# Patient Record
Sex: Female | Born: 1935 | Race: White | Hispanic: No
Health system: Southern US, Community
[De-identification: ages and names within clinical notes are randomized; demographics above are authoritative.]

## PROBLEM LIST (undated history)

## (undated) DIAGNOSIS — I10 Essential (primary) hypertension: Secondary | ICD-10-CM

## (undated) DIAGNOSIS — F32A Depression, unspecified: Secondary | ICD-10-CM

## (undated) DIAGNOSIS — F319 Bipolar disorder, unspecified: Secondary | ICD-10-CM

## (undated) DIAGNOSIS — G309 Alzheimer's disease, unspecified: Secondary | ICD-10-CM

## (undated) DIAGNOSIS — F0280 Dementia in other diseases classified elsewhere without behavioral disturbance: Secondary | ICD-10-CM

## (undated) DIAGNOSIS — F329 Major depressive disorder, single episode, unspecified: Secondary | ICD-10-CM

## (undated) DIAGNOSIS — M1990 Unspecified osteoarthritis, unspecified site: Secondary | ICD-10-CM

## (undated) DIAGNOSIS — E7800 Pure hypercholesterolemia, unspecified: Secondary | ICD-10-CM

---

## 2002-04-20 ENCOUNTER — Inpatient Hospital Stay (HOSPITAL_COMMUNITY): Admission: EM | Admit: 2002-04-20 | Discharge: 2002-04-28 | Payer: Self-pay | Admitting: Psychiatry

## 2002-04-22 ENCOUNTER — Encounter: Payer: Self-pay | Admitting: Psychiatry

## 2006-08-26 ENCOUNTER — Ambulatory Visit: Payer: Self-pay | Admitting: Gastroenterology

## 2008-08-12 ENCOUNTER — Ambulatory Visit: Payer: Self-pay | Admitting: Family Medicine

## 2009-03-15 ENCOUNTER — Ambulatory Visit: Payer: Self-pay | Admitting: Family Medicine

## 2012-07-05 ENCOUNTER — Emergency Department: Payer: Self-pay | Admitting: Emergency Medicine

## 2012-07-05 LAB — URINALYSIS, COMPLETE
Bacteria: NONE SEEN
Bilirubin,UR: NEGATIVE
Glucose,UR: NEGATIVE mg/dL (ref 0–75)
Ketone: NEGATIVE
Leukocyte Esterase: NEGATIVE
Nitrite: NEGATIVE
Ph: 5 (ref 4.5–8.0)
Protein: NEGATIVE
RBC,UR: 9 /HPF (ref 0–5)
Specific Gravity: 1.021 (ref 1.003–1.030)
Squamous Epithelial: NONE SEEN
WBC UR: 2 /HPF (ref 0–5)

## 2012-07-05 LAB — COMPREHENSIVE METABOLIC PANEL
Albumin: 4 g/dL (ref 3.4–5.0)
Alkaline Phosphatase: 89 U/L (ref 50–136)
Anion Gap: 6 — ABNORMAL LOW (ref 7–16)
BUN: 22 mg/dL — ABNORMAL HIGH (ref 7–18)
Bilirubin,Total: 0.2 mg/dL (ref 0.2–1.0)
Calcium, Total: 9.1 mg/dL (ref 8.5–10.1)
Chloride: 107 mmol/L (ref 98–107)
Co2: 28 mmol/L (ref 21–32)
Creatinine: 0.85 mg/dL (ref 0.60–1.30)
EGFR (African American): >60
EGFR (Non-African Amer.): >60
Glucose: 90 mg/dL (ref 65–99)
Osmolality: 284 (ref 275–301)
Potassium: 4.1 mmol/L (ref 3.5–5.1)
SGOT(AST): 33 U/L (ref 15–37)
SGPT (ALT): 23 U/L (ref 12–78)
Sodium: 141 mmol/L (ref 136–145)
Total Protein: 7.3 g/dL (ref 6.4–8.2)

## 2012-07-05 LAB — CBC WITH DIFFERENTIAL/PLATELET
Basophil #: 0 10*3/uL (ref 0.0–0.1)
Basophil %: 0.4 %
Eosinophil #: 0.1 10*3/uL (ref 0.0–0.7)
Eosinophil %: 0.6 %
HCT: 40.2 % (ref 35.0–47.0)
HGB: 13.5 g/dL (ref 12.0–16.0)
Lymphocyte #: 1.3 10*3/uL (ref 1.0–3.6)
Lymphocyte %: 15.7 %
MCH: 32.1 pg (ref 26.0–34.0)
MCHC: 33.7 g/dL (ref 32.0–36.0)
MCV: 96 fL (ref 80–100)
Monocyte #: 0.6 x10 3/mm (ref 0.2–0.9)
Monocyte %: 7.7 %
Neutrophil #: 6.1 10*3/uL (ref 1.4–6.5)
Neutrophil %: 75.6 %
Platelet: 175 10*3/uL (ref 150–440)
RBC: 4.21 10*6/uL (ref 3.80–5.20)
RDW: 12.5 % (ref 11.5–14.5)
WBC: 8.1 10*3/uL (ref 3.6–11.0)

## 2012-07-05 LAB — RAPID INFLUENZA A&B ANTIGENS

## 2012-07-05 LAB — TSH: Thyroid Stimulating Horm: 2.4 u[IU]/mL

## 2012-07-07 LAB — URINE CULTURE

## 2012-07-08 ENCOUNTER — Emergency Department: Payer: Self-pay | Admitting: Emergency Medicine

## 2012-07-08 LAB — COMPREHENSIVE METABOLIC PANEL
Albumin: 3.7 g/dL (ref 3.4–5.0)
Alkaline Phosphatase: 78 U/L (ref 50–136)
Anion Gap: 8 (ref 7–16)
BUN: 23 mg/dL — ABNORMAL HIGH (ref 7–18)
Bilirubin,Total: 0.2 mg/dL (ref 0.2–1.0)
Calcium, Total: 8.9 mg/dL (ref 8.5–10.1)
Chloride: 109 mmol/L — ABNORMAL HIGH (ref 98–107)
Co2: 29 mmol/L (ref 21–32)
Creatinine: 0.74 mg/dL (ref 0.60–1.30)
EGFR (African American): >60
EGFR (Non-African Amer.): >60
Glucose: 97 mg/dL (ref 65–99)
Osmolality: 294 (ref 275–301)
Potassium: 4.4 mmol/L (ref 3.5–5.1)
SGOT(AST): 35 U/L (ref 15–37)
SGPT (ALT): 23 U/L (ref 12–78)
Sodium: 146 mmol/L — ABNORMAL HIGH (ref 136–145)
Total Protein: 7.1 g/dL (ref 6.4–8.2)

## 2012-07-08 LAB — CBC
HCT: 38.8 % (ref 35.0–47.0)
HGB: 13 g/dL (ref 12.0–16.0)
MCH: 32.3 pg (ref 26.0–34.0)
MCHC: 33.4 g/dL (ref 32.0–36.0)
MCV: 97 fL (ref 80–100)
Platelet: 181 10*3/uL (ref 150–440)
RBC: 4.02 10*6/uL (ref 3.80–5.20)
RDW: 13.1 % (ref 11.5–14.5)
WBC: 7.2 10*3/uL (ref 3.6–11.0)

## 2012-07-08 LAB — URINALYSIS, COMPLETE
Bilirubin,UR: NEGATIVE
Blood: NEGATIVE
Glucose,UR: NEGATIVE mg/dL (ref 0–75)
Hyaline Cast: 5
Ph: 5 (ref 4.5–8.0)
RBC,UR: 5 /HPF (ref 0–5)
Specific Gravity: 1.026 (ref 1.003–1.030)
Squamous Epithelial: <1
WBC UR: 4 /HPF (ref 0–5)

## 2012-07-08 LAB — CK TOTAL AND CKMB (NOT AT ARMC): CK-MB: 1.4 ng/mL (ref 0.5–3.6)

## 2012-07-11 LAB — CULTURE, BLOOD (SINGLE)

## 2012-07-16 LAB — COMPREHENSIVE METABOLIC PANEL
Albumin: 3.4 g/dL (ref 3.4–5.0)
Anion Gap: 3 — ABNORMAL LOW (ref 7–16)
Bilirubin,Total: 0.3 mg/dL (ref 0.2–1.0)
Calcium, Total: 8.9 mg/dL (ref 8.5–10.1)
Chloride: 109 mmol/L — ABNORMAL HIGH (ref 98–107)
Creatinine: 0.62 mg/dL (ref 0.60–1.30)
EGFR (Non-African Amer.): >60
Glucose: 86 mg/dL (ref 65–99)
Osmolality: 288 (ref 275–301)
Potassium: 4 mmol/L (ref 3.5–5.1)
SGPT (ALT): 18 U/L (ref 12–78)
Sodium: 142 mmol/L (ref 136–145)

## 2012-07-16 LAB — CBC
HGB: 14.1 g/dL (ref 12.0–16.0)
MCHC: 32.8 g/dL (ref 32.0–36.0)
MCV: 97 fL (ref 80–100)
WBC: 5.9 10*3/uL (ref 3.6–11.0)

## 2012-07-16 LAB — TROPONIN I: Troponin-I: <0.02 ng/mL

## 2012-07-20 LAB — URINALYSIS, COMPLETE
Ketone: NEGATIVE
Ph: 5 (ref 4.5–8.0)
Protein: NEGATIVE
RBC,UR: 4 /HPF (ref 0–5)
Specific Gravity: 1.026 (ref 1.003–1.030)
Squamous Epithelial: NONE SEEN
WBC UR: 41 /HPF (ref 0–5)

## 2014-05-19 ENCOUNTER — Emergency Department: Payer: Self-pay | Admitting: Emergency Medicine

## 2014-05-20 ENCOUNTER — Inpatient Hospital Stay: Payer: Self-pay | Admitting: Internal Medicine

## 2014-05-20 LAB — CBC WITH DIFFERENTIAL/PLATELET
Basophil #: 0 10*3/uL (ref 0.0–0.1)
Basophil %: 0.3 %
Eosinophil #: 0 10*3/uL (ref 0.0–0.7)
Eosinophil %: 0 %
HCT: 38 % (ref 35.0–47.0)
HGB: 12.6 g/dL (ref 12.0–16.0)
Lymphocyte #: 0.2 10*3/uL — ABNORMAL LOW (ref 1.0–3.6)
Lymphocyte %: 1.3 %
MCH: 32.3 pg (ref 26.0–34.0)
MCHC: 33.3 g/dL (ref 32.0–36.0)
MCV: 97 fL (ref 80–100)
MONO ABS: 1.1 x10 3/mm — AB (ref 0.2–0.9)
Monocyte %: 5.8 %
Neutrophil #: 17.2 10*3/uL — ABNORMAL HIGH (ref 1.4–6.5)
Neutrophil %: 92.6 %
PLATELETS: 159 10*3/uL (ref 150–440)
RBC: 3.92 10*6/uL (ref 3.80–5.20)
RDW: 13.2 % (ref 11.5–14.5)
WBC: 18.6 10*3/uL — ABNORMAL HIGH (ref 3.6–11.0)

## 2014-05-20 LAB — URINALYSIS, COMPLETE
Bilirubin,UR: NEGATIVE
Glucose,UR: NEGATIVE mg/dL (ref 0–75)
Ketone: NEGATIVE
Nitrite: POSITIVE
Ph: 5 (ref 4.5–8.0)
Protein: NEGATIVE
RBC,UR: 702 /HPF (ref 0–5)
SPECIFIC GRAVITY: 1.021 (ref 1.003–1.030)
SQUAMOUS EPITHELIAL: NONE SEEN
WBC UR: 55 /HPF (ref 0–5)

## 2014-05-20 LAB — BASIC METABOLIC PANEL
Anion Gap: 5 — ABNORMAL LOW (ref 7–16)
Anion Gap: 7 (ref 7–16)
BUN: 28 mg/dL — AB (ref 7–18)
BUN: 25 mg/dL — ABNORMAL HIGH (ref 7–18)
CALCIUM: 8.8 mg/dL (ref 8.5–10.1)
CO2: 29 mmol/L (ref 21–32)
CREATININE: 1.24 mg/dL (ref 0.60–1.30)
Calcium, Total: 8.9 mg/dL (ref 8.5–10.1)
Chloride: 109 mmol/L — ABNORMAL HIGH (ref 98–107)
Chloride: 106 mmol/L (ref 98–107)
Co2: 30 mmol/L (ref 21–32)
Creatinine: 0.79 mg/dL (ref 0.60–1.30)
EGFR (African American): >60
EGFR (African American): 54 — ABNORMAL LOW
EGFR (Non-African Amer.): >60
EGFR (Non-African Amer.): 45 — ABNORMAL LOW
GLUCOSE: 102 mg/dL — AB (ref 65–99)
Glucose: 113 mg/dL — ABNORMAL HIGH (ref 65–99)
Osmolality: 293 (ref 275–301)
Osmolality: 288 (ref 275–301)
POTASSIUM: 4 mmol/L (ref 3.5–5.1)
POTASSIUM: 3.3 mmol/L — AB (ref 3.5–5.1)
SODIUM: 142 mmol/L (ref 136–145)
Sodium: 144 mmol/L (ref 136–145)

## 2014-05-20 LAB — CBC
HCT: 37.1 % (ref 35.0–47.0)
HGB: 12.2 g/dL (ref 12.0–16.0)
MCH: 32.4 pg (ref 26.0–34.0)
MCHC: 33 g/dL (ref 32.0–36.0)
MCV: 98 fL (ref 80–100)
Platelet: 155 10*3/uL (ref 150–440)
RBC: 3.77 10*6/uL — ABNORMAL LOW (ref 3.80–5.20)
RDW: 13.2 % (ref 11.5–14.5)
WBC: 6.8 10*3/uL (ref 3.6–11.0)

## 2014-05-20 LAB — TROPONIN I

## 2014-05-20 LAB — PRO B NATRIURETIC PEPTIDE: B-Type Natriuretic Peptide: 106 pg/mL (ref 0–450)

## 2014-05-21 LAB — CBC WITH DIFFERENTIAL/PLATELET
BASOS PCT: 0.4 %
Basophil #: 0 10*3/uL (ref 0.0–0.1)
Eosinophil #: 0 10*3/uL (ref 0.0–0.7)
Eosinophil %: 0.2 %
HCT: 31.7 % — ABNORMAL LOW (ref 35.0–47.0)
HGB: 10.5 g/dL — AB (ref 12.0–16.0)
LYMPHS ABS: 0.4 10*3/uL — AB (ref 1.0–3.6)
LYMPHS PCT: 4.6 %
MCH: 32.5 pg (ref 26.0–34.0)
MCHC: 33.1 g/dL (ref 32.0–36.0)
MCV: 98 fL (ref 80–100)
MONOS PCT: 6.4 %
Monocyte #: 0.5 x10 3/mm (ref 0.2–0.9)
Neutrophil #: 6.9 10*3/uL — ABNORMAL HIGH (ref 1.4–6.5)
Neutrophil %: 88.4 %
Platelet: 112 10*3/uL — ABNORMAL LOW (ref 150–440)
RBC: 3.24 10*6/uL — ABNORMAL LOW (ref 3.80–5.20)
RDW: 13.6 % (ref 11.5–14.5)
WBC: 7.8 10*3/uL (ref 3.6–11.0)

## 2014-05-21 LAB — BASIC METABOLIC PANEL
Anion Gap: 5 — ABNORMAL LOW (ref 7–16)
BUN: 22 mg/dL — ABNORMAL HIGH (ref 7–18)
CALCIUM: 8.2 mg/dL — AB (ref 8.5–10.1)
Chloride: 111 mmol/L — ABNORMAL HIGH (ref 98–107)
Co2: 26 mmol/L (ref 21–32)
Creatinine: 0.82 mg/dL (ref 0.60–1.30)
GLUCOSE: 85 mg/dL (ref 65–99)
Osmolality: 286 (ref 275–301)
Potassium: 3.4 mmol/L — ABNORMAL LOW (ref 3.5–5.1)
Sodium: 142 mmol/L (ref 136–145)

## 2014-05-21 LAB — MAGNESIUM: Magnesium: 1.9 mg/dL

## 2014-05-22 LAB — BASIC METABOLIC PANEL
ANION GAP: 8 (ref 7–16)
BUN: 14 mg/dL (ref 7–18)
Calcium, Total: 8.3 mg/dL — ABNORMAL LOW (ref 8.5–10.1)
Chloride: 111 mmol/L — ABNORMAL HIGH (ref 98–107)
Co2: 25 mmol/L (ref 21–32)
Creatinine: 0.68 mg/dL (ref 0.60–1.30)
GLUCOSE: 87 mg/dL (ref 65–99)
OSMOLALITY: 287 (ref 275–301)
Potassium: 3.7 mmol/L (ref 3.5–5.1)
Sodium: 144 mmol/L (ref 136–145)

## 2014-05-22 LAB — PLATELET COUNT: Platelet: 119 10*3/uL — ABNORMAL LOW (ref 150–440)

## 2014-05-23 LAB — PLATELET COUNT: PLATELETS: 128 10*3/uL — AB (ref 150–440)

## 2014-05-23 LAB — URINE CULTURE

## 2014-05-25 LAB — CULTURE, BLOOD (SINGLE)

## 2014-06-12 ENCOUNTER — Emergency Department: Payer: Self-pay | Admitting: Emergency Medicine

## 2014-06-30 ENCOUNTER — Emergency Department: Payer: Self-pay | Admitting: Emergency Medicine

## 2014-07-10 ENCOUNTER — Emergency Department: Payer: Self-pay | Admitting: Emergency Medicine

## 2014-08-03 ENCOUNTER — Emergency Department: Payer: Self-pay | Admitting: Emergency Medicine

## 2014-09-09 NOTE — Consult Note (Signed)
Details:   - Psychiatry: Came to check on patient. She is still awake but not communicating. Not getting agressive. We are still waiting for a bed at a gero unit per the wishes of the sisters.   Electronic Signatures: Audery Amellapacs, Esabella Stockinger T (MD)  (Signed 21-Feb-14 23:23)  Authored: Details   Last Updated: 21-Feb-14 23:23 by Audery Amellapacs, Bettyann Birchler T (MD)

## 2014-09-09 NOTE — Consult Note (Signed)
Brief Consult Note: Comments: Psychiatry: Update to previous note. The patient's sister, who is power of attorney, met me in the hall and told me she very much wants the patient to go to [place]. Patient is under IVC. I relayed this to the intake nurse and we will initiate referral process to find a geeropsychiatry bed. Meanwhile continnue IVC. I will write for full dose of 69m Zyprexa.  Electronic Signatures: CGonzella Lex(MD)  (Signed 19-Feb-14 18:51)  Authored: Brief Consult Note   Last Updated: 19-Feb-14 18:51 by CGonzella Lex(MD)

## 2014-09-09 NOTE — Consult Note (Signed)
Brief Consult Note: Diagnosis: Dementia Alzhiemer's type, Bipolar DO NOS.   Patient was seen by consultant.   Recommend further assessment or treatment.   Comments: Pt seen in ED. She remains calm but has episode of agitation at times. She did not respond to any questions. Compliant with meds.  Awaiting for disposition.  Will add Namenda 5mg  po qdaily to help with her agitation.  Continue other meds.  Electronic Signatures: Rhunette CroftFaheem, Tionne Dayhoff S (MD)  (Signed 25-Feb-14 12:41)  Authored: Brief Consult Note   Last Updated: 25-Feb-14 12:41 by Rhunette CroftFaheem, Joron Velis S (MD)
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
## 2014-09-09 NOTE — Consult Note (Signed)
Brief Consult Note: Diagnosis: Dementia, Alzhiemer type. Bipolar DO NOS.   Patient was seen by consultant.   Orders entered.   Comments: Pt remains mute and she did not respond to any of the questions. Most of the history obtained from the staff and her records. Staff reported that she is not eating well. She opens her eyes intermittently since this morning. No aggressive behavior noted.  Plan: Will decrease Zyprexa to 2.5 mg po qam and 5mg  po qhs.  Decrease Celexa 20 mg po qhs.  Continue with Aricept.  Awaiting placement.  Electronic Signatures: Rhunette CroftFaheem, Ansh Fauble S (MD)  (Signed 20-Feb-14 10:35)  Authored: Brief Consult Note   Last Updated: 20-Feb-14 10:35 by Rhunette CroftFaheem, Javyn Havlin S (MD)

## 2014-09-09 NOTE — Consult Note (Signed)
Brief Consult Note: Diagnosis: Dementia Alzhiemer's type, Bipolar DO NOS.   Patient was seen by consultant.   Recommend further assessment or treatment.   Comments: Pt seen in ED. She was sleeping this am and did not participate in interview. Staff reported that she has episodes of agitation at times. She has been compliant with medications and is still awaiting disposition.  She was started on Namenda recently and she is tolerating it well. Awaiting placement.  Electronic Signatures: Rhunette CroftFaheem, Sonjia Wilcoxson S (MD)  (Signed 27-Feb-14 12:17)  Authored: Brief Consult Note   Last Updated: 27-Feb-14 12:17 by Rhunette CroftFaheem, Keyri Salberg S (MD)
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
## 2014-09-14 NOTE — H&P (Signed)
PATIENT NAME:  Barnabas HarriesRUMBLEY, Joanna D MR#:  [id number] DATE OF BIRTH:  Oct 16, 1935  DATE OF ADMISSION:  05/20/2014  PRIMARY CARE PHYSICIAN:  [facility], Dr. Roxanne MinsPaula Bhaglia.    CHIEF COMPLAINT:  Brought back into the ER secondary to fever of 103.   HISTORY OF PRESENT ILLNESS: This is a 79 year old female who was brought to the hospital yesterday, was diagnosed with a urinary tract infection, given a script for Bactrim and sent back to [facility]. Her blood pressure in the ER was up and she was clammy and sweaty and not responding as well as she normally does. Back at [facility] she spiked a fever of 103, vomited, and then her blood pressure was low. She had laboratories that were redrawn here in the ER, white count was elevated at 18,000. Urinalysis from last night was positive and she had a fever of 103 at the facility and was given Tylenol for that. The patient secondary to dementia is unable to give any history. The patient's sisters are at the bedside, able to give history.   PAST MEDICAL HISTORY: Alzheimer's, bipolar disorder, hypertension, osteoarthritis, hyperlipidemia, meningioma on the brain.   PAST SURGICAL HISTORY: Hysterectomy, possible tonsillectomy in the past.   ALLERGIES: CEPHALOSPORIN, CIPRO, CODEINE, NEURONTIN, PENICILLIN, AND SHELLFISH.   MEDICATIONS: As per prescription writer include aspirin 81 mg daily, Zyrtec 10 mg daily, Celexa 20 mg daily, Colace 10 mg/mL 10 mL twice a day, benazepril 10 mg daily, lamotrigine 100 mg daily, loperamide 2 mg p.r.n., lovastatin 40 mg daily, Mapap 500 mg every 4 hours as needed for headache or pain, meloxicam 7.5 mg daily, Namenda 5 mg twice a day, Maalox 30 mL 4 times a day as needed for indigestion, milk of magnesia 30 mL once a day at bedtime as needed for constipation, nystatin topical under breasts and buttock twice a day as needed, olanzapine 2.5 mg during the day and 5 mg at night, Risamine 0.44% and 20.625% topical ointment to  affected area twice a day as needed, Robitussin 10 mL every 6 hours as needed, trazodone 50 mg half tablet every 12 hours as needed for agitation, triple antibiotic solution as needed, vitamin B1, 100 mg daily, vitamin B12, 1000 mcg daily, vitamin D3, 1000 international units daily.   SOCIAL HISTORY: Currently lives at [facility]. No smoking. No alcohol. No drug use. Was a medical transcriptionist in the past.    FAMILY HISTORY: Mother was killed and father committed suicide in a murder/suicide many years ago.   REVIEW OF SYSTEMS: Unable to obtain secondary to altered mental status and dementia.   PHYSICAL EXAMINATION:  VITAL SIGNS: Current temperature 98.1, pulse 68, respirations 17, blood pressure 96/56, pulse oximetry 99% on room air.  GENERAL: No respiratory distress. Looking ill, lying in bed.  EYES: Conjunctivae and lids normal. Pupils equal, round, and reactive to light. Unable to test extraocular muscles.  EARS, NOSE, MOUTH, AND THROAT: Tympanic membranes no erythema. Nasal mucosa, no erythema. Throat, no erythema, no exudate seen. Lips and gums, no lesions.  NECK: No JVD. No bruits. No lymphadenopathy. No thyromegaly. No thyroid nodules palpated.  LUNGS: Clear to auscultation. No use of accessory muscles to breathe. No rhonchi, rales, or wheeze heard.  CARDIOVASCULAR: S1, S2 normal, positive 3 out of 6 systolic ejection murmur. Carotid upstroke 2 + bilaterally. No bruits.  EXTREMITIES: Dorsalis pedis pulses 1 + bilaterally. 2 + edema bilateral lower extremity.  ABDOMEN: Soft. Slight tenderness over the bladder area and the  lower abdomen. No organomegaly/splenomegaly. Normoactive bowel sounds. No masses felt.  LYMPHATIC: No lymph nodes in the neck.  MUSCULOSKELETAL: No clubbing. No cyanosis. 2 + edema.  SKIN: Sunken-in eyelids.  NEUROLOGIC: Cranial nerves difficult to test secondary to altered mental status, was able to move her arms a little bit. Deep tendon reflexes 1 +  bilateral lower extremity. Babinski negative. PSYCHIATRIC: Unable to test secondary to altered mental status. Did answer 1 question, no.   LABORATORY AND RADIOLOGICAL DATA: Troponin negative. White blood cell count 6.8, H and H 12.2 and 37.1, platelet count of 155,000. Glucose 102, BUN 28, creatinine 0.79, sodium 144, potassium 4.0, chloride 109, CO2 of 30, calcium 8.9, those were laboratories from last night. Repeat laboratories show a lactic acid of 3.3. White blood cell count 18.6, hemoglobin 12.6, hematocrit 38.0, platelet count of 159,000. Glucose 113, BUN 25, creatinine 1.24, sodium 142, potassium 3.3, chloride 106, CO2 of 29, calcium 8.8. Troponin negative. Urinalysis from last night trace leukocyte esterase, positive for nitrites. Chest x-ray negative from last night and CT scan of the head showed unchanged medial left frontal lobe mass compatible with meningioma, unchanged mass effect, atrophy, and chronic ischemic white matter disease.   ASSESSMENT AND PLAN:  1.  Clinical sepsis with fever of 103 as outpatient and leukocytosis, urinary tract infection, cystitis, and hypotension. We will give IV fluid hydration, IV Levaquin. Blood cultures ordered. Urine culture sent from last night. Continue to monitor clinically. Overall prognosis is poor. The patient was made a DNR in the Emergency Room.  2.  Bipolar disorder and dementia. Continue usual psychiatric medications if able to take. 3.  Hyperlipidemia. Continue statin medication.  4.  Osteoarthritis. Continue p.r.n. pain medications and anti-inflammatories.   TIME SPENT ON ADMISSION: 50 minutes.   CODE STATUS:  The patient is a Do Not Resuscitate.      ____________________________ Herschell Dimes. Renae Gloss, MD rjw:bu D: 05/20/2014 18:18:23 ET T: 05/20/2014 18:36:44 ET JOB#: [id number]  cc: Herschell Dimes. Renae Gloss, MD, <Dictator> Salley Scarlet MD ELECTRONICALLY SIGNED 05/29/2014 15:09

## 2014-09-18 NOTE — Discharge Summary (Signed)
PATIENT NAME:  Barnabas HarriesRUMBLEY, Joanna D MR#:  [id number] DATE OF BIRTH:  08/28/1935  DATE OF ADMISSION:  05/20/2014 DATE OF DISCHARGE:  05/23/2014  DISCHARGE DIAGNOSES: Sepsis with urinary tract infection, cystitis, bipolar disorder, dementia, and hyperlipidemia.   CONDITION: Stable.   CODE STATUS: DNR.  HOME MEDICATIONS: Please refer to the medication reconciliation list.   DIET: Low-sodium, low-fat, low-cholesterol diet.   ACTIVITY: As tolerated.   FOLLOWUP CARE: Follow with PCP within 1-2 weeks.   REASON FOR ADMISSION: Fever of 103.   HISTORY OF PRESENT ILLNESS: The patient is a 79 year old Caucasian female with a history of Alzheimer dementia, bipolar disorder, hypertension, and hyperlipidemia, who was sent from the nursing home due to a fever of 103. The patient was noticed to have elevated WBC of 18,000. Urinalysis showed UTI.   For detailed history and physical examination, please refer to the admission note dictated by Dr. Renae GlossWieting.   LABORATORY DATA: Troponin was negative. WBC 18,000. BUN 28, creatinine 0.79. Electrolytes were normal.   HOSPITAL COURSE: 1.  Sepsis with urinary tract infection/cystitis. The patient has been treated with IV fluid support with IV Levaquin. Blood culture is negative. Urine culture showed E Coli. After IV antibiotic treatment, the patient's white count decreased to 7.8.  Urine culture showed E Coli. The patient will need bactrim po for 5 days after discharge. 2.  Dehydration, which has been treated with IV fluid support. BUN decreased to 14.  The patient is demented, but has no complaints. Vital signs are stable.   PHYSICAL EXAMINATION: Unremarkable. In no acute distress. Vital signs are stable. She is clinically stable, and will be discharged to the nursing home today.   I discussed the patient's discharge plan with the patient, the nurse, the social worker and the case manager.   TIME SPENT: About 38 minutes.    ____________________________ Shaune PollackQing  Keisha Amer, MD qc:MT D: 05/23/2014 11:37:38 ET T: 05/23/2014 12:16:51 ET JOB#: [id number]  cc: Shaune PollackQing Hyder Deman, MD, <Dictator> Shaune PollackQING Leevon Upperman MD ELECTRONICALLY SIGNED 05/23/2014 17:07

## 2014-11-25 ENCOUNTER — Encounter: Payer: Self-pay | Admitting: Emergency Medicine

## 2014-11-25 ENCOUNTER — Emergency Department
Admission: EM | Admit: 2014-11-25 | Discharge: 2014-11-25 | Disposition: A | Discharge status: Home / Self Care | Payer: Medicare Other | Attending: Emergency Medicine | Admitting: Emergency Medicine

## 2014-11-25 ENCOUNTER — Emergency Department: Payer: Medicare Other

## 2014-11-25 DIAGNOSIS — I10 Essential (primary) hypertension: Secondary | ICD-10-CM | POA: Diagnosis not present

## 2014-11-25 DIAGNOSIS — W1839XA Other fall on same level, initial encounter: Secondary | ICD-10-CM | POA: Insufficient documentation

## 2014-11-25 DIAGNOSIS — Y92129 Unspecified place in nursing home as the place of occurrence of the external cause: Secondary | ICD-10-CM | POA: Insufficient documentation

## 2014-11-25 DIAGNOSIS — Y9389 Activity, other specified: Secondary | ICD-10-CM | POA: Insufficient documentation

## 2014-11-25 DIAGNOSIS — F319 Bipolar disorder, unspecified: Secondary | ICD-10-CM | POA: Diagnosis not present

## 2014-11-25 DIAGNOSIS — Y998 Other external cause status: Secondary | ICD-10-CM | POA: Insufficient documentation

## 2014-11-25 DIAGNOSIS — S0031XA Abrasion of nose, initial encounter: Secondary | ICD-10-CM | POA: Insufficient documentation

## 2014-11-25 DIAGNOSIS — S3992XA Unspecified injury of lower back, initial encounter: Secondary | ICD-10-CM | POA: Diagnosis present

## 2014-11-25 DIAGNOSIS — Z880 Allergy status to penicillin: Secondary | ICD-10-CM | POA: Insufficient documentation

## 2014-11-25 DIAGNOSIS — G309 Alzheimer's disease, unspecified: Secondary | ICD-10-CM | POA: Insufficient documentation

## 2014-11-25 DIAGNOSIS — W19XXXA Unspecified fall, initial encounter: Secondary | ICD-10-CM

## 2014-11-25 DIAGNOSIS — F0280 Dementia in other diseases classified elsewhere without behavioral disturbance: Secondary | ICD-10-CM | POA: Insufficient documentation

## 2014-11-25 DIAGNOSIS — S300XXA Contusion of lower back and pelvis, initial encounter: Secondary | ICD-10-CM | POA: Insufficient documentation

## 2014-11-25 HISTORY — DX: Alzheimer's disease, unspecified: G30.9

## 2014-11-25 HISTORY — DX: Essential (primary) hypertension: I10

## 2014-11-25 HISTORY — DX: Unspecified osteoarthritis, unspecified site: M19.90

## 2014-11-25 HISTORY — DX: Pure hypercholesterolemia, unspecified: E78.00

## 2014-11-25 HISTORY — DX: Bipolar disorder, unspecified: F31.9

## 2014-11-25 HISTORY — DX: Depression, unspecified: F32.A

## 2014-11-25 HISTORY — DX: Dementia in other diseases classified elsewhere, unspecified severity, without behavioral disturbance, psychotic disturbance, mood disturbance, and anxiety: F02.80

## 2014-11-25 HISTORY — DX: Major depressive disorder, single episode, unspecified: F32.9

## 2014-11-25 NOTE — ED Notes (Signed)
Pt presents to ED via EMS with c/o of fall from [facility]. EMS states pt was found by staff laying supine in facility walkway, unknown of fall origin/cause. EMS reports fall was unwitnessed by staff. EMS states pt has a hx of falls from current nursing facility. EMS reports no obvious deformities and lacerations upon arrival to ER. Pt arrived non-verbal and correlated with current neurological baseline. No obvious physical deformities noted. No lacerations noted.

## 2014-11-25 NOTE — Discharge Instructions (Signed)
Fall Prevention in Hospitals °As a hospital patient, your condition and the treatments you receive can increase your risk for falls. Some additional risk factors for falls in a hospital include: °· Being in an unfamiliar environment. °· Being on bed rest. °· Your surgery. °· Taking certain medicines. °· Your tubing requirements, such as intravenous (IV) therapy or catheters. °It is important that you learn how to decrease fall risks while at the hospital. Below are important tips that can help prevent falls. °SAFETY TIPS FOR PREVENTING FALLS °Talk about your risk of falling. °· Ask your caregiver why you are at risk for falling. Is it your medicine, illness, tubing placement, or something else? °· Make a plan with your caregiver to keep you safe from falls. °· Ask your caregiver or pharmacist about side effect of your medicines. Some medicines can make you dizzy or affect your coordination. °Ask for help. °· Ask for help before getting out of bed. You may need to press your call button. °· Ask for assistance in getting you safely to the toilet. °· Ask for a walker or cane to be put at your bedside. Ask that most of the side rails on your bed be placed up before your caregiver leaves the room. °· Ask family or friends to sit with you. °· Ask for things that are out of your reach, such as your glasses, hearing aids, telephone, bedside table, or call button. °Follow these tips to avoid falling: °· Stay lying or seated, rather than standing, while waiting for help. °· Wear rubber-soled slippers or shoes whenever you walk in the hospital. °· Avoid quick, sudden movements. °¨ Change positions slowly. °¨ Sit on the side of your bed before standing. °¨ Stand up slowly and wait before you start to walk. °· Let your caregiver know if there is a spill on the floor. °· Pay careful attention to the medical equipment, electrical cords, and tubes around you. °· When you need help, use your call button by your bed or in the  bathroom. Wait for one of your caregivers to help you. °· If you feel dizzy or unsure of your footing, return to bed and wait for assistance. °· Avoid being distracted by the TV, telephone, or another person in your room. °· Do not lean or support yourself on rolling objects, such as IV poles or bedside tables. °Document Released: 05/03/2000 Document Revised: 04/22/2012 Document Reviewed: 01/12/2012 °[employer]® Patient Information ©2015 [employer]. This information is not intended to replace advice given to you by your health care provider. Make sure you discuss any questions you have with your health care provider. ° °

## 2014-11-25 NOTE — ED Notes (Signed)
Patient back from  X-ray 

## 2014-11-25 NOTE — ED Notes (Signed)
Patient transported to X-ray 

## 2014-11-25 NOTE — ED Provider Notes (Signed)
[facility] Emergency Department Provider Note  ____________________________________________  Time seen: Approximately 6 AM  I have reviewed the triage vital signs and the nursing notes.   HISTORY  Chief Complaint Fall    HPI Joanna Wade is a 79 y.o. female with a history of dementia, in the memory care unit for the past 2 years who presents today with a possible fall. Per the staff and EMS the patient was found sitting in the hallway today at her memory care unit. The staff noted a small abrasion to the left tip of her nose which the siblings knee. They were unsure if she may have hit her head. The patient has had multiple falls and is at her baseline mental status per her staff and also the patient's sister, Ms. Joanna Wade.     Past Medical History  Diagnosis Date  . Alzheimer disease   . Bipolar 1 disorder   . Depression   . Arthritis   . Hypertension   . Hypercholesteremia     There are no active problems to display for this patient.   History reviewed. No pertinent past surgical history.  No current outpatient prescriptions on file.  Allergies Cephalosporins; Codeine; Gabapentin; Penicillins; and Quinolones  History reviewed. No pertinent family history.  Social History History  Substance Use Topics  . Smoking status: Unknown If Ever Smoked  . Smokeless tobacco: Not on file  . Alcohol Use: No    Review of Systems Caveat due to patient with advanced dementia and not able to answer questions. ____________________________________________   PHYSICAL EXAM:  VITAL SIGNS: ED Triage Vitals  Enc Vitals Group     BP 11/25/14 0608 118/85 mmHg     Pulse Rate 11/25/14 0608 91     Resp 11/25/14 0608 19     Temp 11/25/14 0608 97.8 F (36.6 C)     Temp Source 11/25/14 0608 Oral     SpO2 11/25/14 0608 97 %     Weight 11/25/14 0608 168 lb (76.204 kg)     Height 11/25/14 0608 5\' 5"  (1.651 m)     Head Cir --      Peak Flow --    Pain Score --      Pain Loc --      Pain Edu? --      Excl. in GC? --     Constitutional: Alert and oriented. Well appearing and in no acute distress. Eyes: Conjunctivae are normal. PERRL. EOMI. Head: Atraumatic. Nose: No congestion/rhinnorhea. Mouth/Throat: Mucous membranes are moist.  Oropharynx non-erythematous. Neck: No stridor.   Cardiovascular: Normal rate, regular rhythm. Grossly normal heart sounds.  Good peripheral circulation. Respiratory: Normal respiratory effort.  No retractions. Lungs CTAB. Gastrointestinal: Soft and nontender. No distention. No abdominal bruits. No CVA tenderness. Musculoskeletal: No lower extremity tenderness nor edema.  No joint effusions. Hips range smoothly bilaterally and without any signs of pain from the patient. Neurologic:   No gross focal neurologic deficits are appreciated. Patient says single words but not in response to any questions. Sister is at the bedside and says this is her baseline. Skin:  Skin is warm, dry and intact. Right buttock with area of ecchymosis that is about 3 x 6 cm. Appears old. Small abrasion to the anterior left exterior of the nose. No active bleeding, induration or pus. Is about 3 mm x 1 cm. Indeterminant age Psychiatric: Mood and affect are normal. Speech and behavior are normal.  ____________________________________________   LABS (all labs ordered  are listed, but only abnormal results are displayed)  Labs Reviewed - No data to display ____________________________________________  EKG   ____________________________________________  RADIOLOGY  No acute findings on the pelvic x-ray. ____________________________________________   PROCEDURES    ____________________________________________   INITIAL IMPRESSION / ASSESSMENT AND PLAN / ED COURSE  Pertinent labs & imaging results that were available during my care of the patient were reviewed by me and considered in my medical decision making (see chart  for details).  ----------------------------------------- 7:12 AM on 11/25/2014 ----------------------------------------- Discussed case with sister. Agrees that limited workup is probably best for the patient. Understands that extensive procedures is a surgery for a intracranial hemorrhage is not realistic at this time. Patient is not in any acute distress and will be discharged back to her memory care unit.  ____________________________________________   FINAL CLINICAL IMPRESSION(S) / ED DIAGNOSES  Acute fall. Return visit.    Myrna Blazer, MD 11/25/14 [phone]

## 2014-12-11 ENCOUNTER — Other Ambulatory Visit: Payer: Self-pay

## 2014-12-11 ENCOUNTER — Emergency Department
Admission: EM | Admit: 2014-12-11 | Discharge: 2014-12-11 | Disposition: A | Discharge status: Home / Self Care | Payer: Medicare Other | Attending: Emergency Medicine | Admitting: Emergency Medicine

## 2014-12-11 ENCOUNTER — Emergency Department: Payer: Medicare Other

## 2014-12-11 ENCOUNTER — Encounter: Payer: Self-pay | Admitting: Emergency Medicine

## 2014-12-11 DIAGNOSIS — I10 Essential (primary) hypertension: Secondary | ICD-10-CM | POA: Diagnosis not present

## 2014-12-11 DIAGNOSIS — Y998 Other external cause status: Secondary | ICD-10-CM | POA: Insufficient documentation

## 2014-12-11 DIAGNOSIS — Z7982 Long term (current) use of aspirin: Secondary | ICD-10-CM | POA: Insufficient documentation

## 2014-12-11 DIAGNOSIS — Z880 Allergy status to penicillin: Secondary | ICD-10-CM | POA: Insufficient documentation

## 2014-12-11 DIAGNOSIS — W01198A Fall on same level from slipping, tripping and stumbling with subsequent striking against other object, initial encounter: Secondary | ICD-10-CM | POA: Diagnosis not present

## 2014-12-11 DIAGNOSIS — Z791 Long term (current) use of non-steroidal anti-inflammatories (NSAID): Secondary | ICD-10-CM | POA: Insufficient documentation

## 2014-12-11 DIAGNOSIS — S0990XA Unspecified injury of head, initial encounter: Secondary | ICD-10-CM | POA: Diagnosis present

## 2014-12-11 DIAGNOSIS — S01112A Laceration without foreign body of left eyelid and periocular area, initial encounter: Secondary | ICD-10-CM | POA: Diagnosis not present

## 2014-12-11 DIAGNOSIS — Y9389 Activity, other specified: Secondary | ICD-10-CM | POA: Diagnosis not present

## 2014-12-11 DIAGNOSIS — Z79899 Other long term (current) drug therapy: Secondary | ICD-10-CM | POA: Diagnosis not present

## 2014-12-11 DIAGNOSIS — Z23 Encounter for immunization: Secondary | ICD-10-CM | POA: Insufficient documentation

## 2014-12-11 DIAGNOSIS — Y92129 Unspecified place in nursing home as the place of occurrence of the external cause: Secondary | ICD-10-CM | POA: Insufficient documentation

## 2014-12-11 DIAGNOSIS — W19XXXA Unspecified fall, initial encounter: Secondary | ICD-10-CM

## 2014-12-11 MED ORDER — LIDOCAINE HCL (PF) 1 % IJ SOLN
5.0000 mL | Freq: Once | INTRAMUSCULAR | Status: AC
  Administered 2014-12-11: 5 mL via INTRADERMAL
  Filled 2014-12-11: qty 5

## 2014-12-11 MED ORDER — TETANUS-DIPHTHERIA TOXOIDS TD 5-2 LFU IM INJ
0.5000 mL | INJECTION | Freq: Once | INTRAMUSCULAR | Status: AC
  Administered 2014-12-11: 0.5 mL via INTRAMUSCULAR
  Filled 2014-12-11: qty 0.5

## 2014-12-11 NOTE — Discharge Instructions (Signed)
Your laceration was closed with absorbable sutures. These will fall out on their own. We gave you a tetanus shot today in the ER.  Facial Laceration  A facial laceration is a cut on the face. These injuries can be painful and cause bleeding. Lacerations usually heal quickly, but they need special care to reduce scarring. DIAGNOSIS  Your health care provider will take a medical history, ask for details about how the injury occurred, and examine the wound to determine how deep the cut is. TREATMENT  Some facial lacerations may not require closure. Others may not be able to be closed because of an increased risk of infection. The risk of infection and the chance for successful closure will depend on various factors, including the amount of time since the injury occurred. The wound may be cleaned to help prevent infection. If closure is appropriate, pain medicines may be given if needed. Your health care provider will use stitches (sutures), wound glue (adhesive), or skin adhesive strips to repair the laceration. These tools bring the skin edges together to allow for faster healing and a better cosmetic outcome. If needed, you may also be given a tetanus shot. HOME CARE INSTRUCTIONS  Only take over-the-counter or prescription medicines as directed by your health care provider.  Follow your health care provider's instructions for wound care. These instructions will vary depending on the technique used for closing the wound. For Sutures:  Keep the wound clean and dry.   If you were given a bandage (dressing), you should change it at least once a day. Also change the dressing if it becomes wet or dirty, or as directed by your health care provider.   Wash the wound with soap and water 2 times a day. Rinse the wound off with water to remove all soap. Pat the wound dry with a clean towel.   After cleaning, apply a thin layer of the antibiotic ointment recommended by your health care provider. This  will help prevent infection and keep the dressing from sticking.   You may shower as usual after the first 24 hours. Do not soak the wound in water until the sutures are removed.   Get your sutures removed as directed by your health care provider. With facial lacerations, sutures should usually be taken out after 4-5 days to avoid stitch marks.   Wait a few days after your sutures are removed before applying any makeup. For Skin Adhesive Strips:  Keep the wound clean and dry.   Do not get the skin adhesive strips wet. You may bathe carefully, using caution to keep the wound dry.   If the wound gets wet, pat it dry with a clean towel.   Skin adhesive strips will fall off on their own. You may trim the strips as the wound heals. Do not remove skin adhesive strips that are still stuck to the wound. They will fall off in time.  For Wound Adhesive:  You may briefly wet your wound in the shower or bath. Do not soak or scrub the wound. Do not swim. Avoid periods of heavy sweating until the skin adhesive has fallen off on its own. After showering or bathing, gently pat the wound dry with a clean towel.   Do not apply liquid medicine, cream medicine, ointment medicine, or makeup to your wound while the skin adhesive is in place. This may loosen the film before your wound is healed.   If a dressing is placed over the wound, be careful not  to apply tape directly over the skin adhesive. This may cause the adhesive to be pulled off before the wound is healed.   Avoid prolonged exposure to sunlight or tanning lamps while the skin adhesive is in place.  The skin adhesive will usually remain in place for 5-10 days, then naturally fall off the skin. Do not pick at the adhesive film.  After Healing: Once the wound has healed, cover the wound with sunscreen during the day for 1 full year. This can help minimize scarring. Exposure to ultraviolet light in the first year will darken the scar. It can  take 1-2 years for the scar to lose its redness and to heal completely.  SEEK IMMEDIATE MEDICAL CARE IF:  You have redness, pain, or swelling around the wound.   You see ayellowish-white fluid (pus) coming from the wound.   You have chills or a fever.  MAKE SURE YOU:  Understand these instructions.  Will watch your condition.  Will get help right away if you are not doing well or get worse. Document Released: 06/13/2004 Document Revised: 02/24/2013 Document Reviewed: 12/17/2012 [facility] Patient Information 2015 [place], [place]. This information is not intended to replace advice given to you by your health care provider. Make sure you discuss any questions you have with your health care provider.  Sutured Wound Care Sutures are stitches that can be used to close wounds. Wound care helps prevent pain and infection.  HOME CARE INSTRUCTIONS   Rest and elevate the injured area until all the pain and swelling are gone.  Only take over-the-counter or prescription medicines for pain, discomfort, or fever as directed by your caregiver.  After 48 hours, gently wash the area with mild soap and water once a day, or as directed. Rinse off the soap. Pat the area dry with a clean towel. Do not rub the wound. This may cause bleeding.  Follow your caregiver's instructions for how often to change the bandage (dressing). Stop using a dressing after 2 days or after the wound stops draining.  If the dressing sticks, moisten it with soapy water and gently remove it.  Apply ointment on the wound as directed.  Avoid stretching a sutured wound.  Drink enough fluids to keep your urine clear or pale yellow.  Follow up with your caregiver for suture removal as directed.  Use sunscreen on your wound for the next 3 to 6 months so the scar will not darken. SEEK IMMEDIATE MEDICAL CARE IF:   Your wound becomes red, swollen, hot, or tender.  You have increasing pain in the wound.  You have a red  streak that extends from the wound.  There is pus coming from the wound.  You have a fever.  You have shaking chills.  There is a bad smell coming from the wound.  You have persistent bleeding from the wound. MAKE SURE YOU:   Understand these instructions.  Will watch your condition.  Will get help right away if you are not doing well or get worse. Document Released: 06/13/2004 Document Revised: 07/29/2011 Document Reviewed: 09/09/2010 [facility] Patient Information 2015 [place], [place]. This information is not intended to replace advice given to you by your health care provider. Make sure you discuss any questions you have with your health care provider.

## 2014-12-11 NOTE — ED Provider Notes (Signed)
[facility] Emergency Department Provider Note  ____________________________________________  Time seen: 4:35 PM on arrival by EMS  I have reviewed the triage vital signs and the nursing notes.   HISTORY  Chief Complaint Fall and Weakness  history Limited by dementia   HPI Joanna Wade is a 79 y.o. female who is transferred from  house for a witnessed mechanical fall where she hit her left forehead. She had a laceration at that time was transferred to the ED for evaluation. The patient is nonverbal at baseline. Otherwise at her baseline mental status and compliant with medications. No syncope or loss of consciousness. No neck pain     Past Medical History  Diagnosis Date  . Alzheimer disease   . Bipolar 1 disorder   . Depression   . Arthritis   . Hypertension   . Hypercholesteremia     There are no active problems to display for this patient.   History reviewed. No pertinent past surgical history.  Current Outpatient Rx  Name  Route  Sig  Dispense  Refill  . aspirin 81 MG tablet   Oral   Take 81 mg by mouth daily.         . calcium carbonate (TITRALAC) 420 MG CHEW chewable tablet   Oral   Chew 420 mg by mouth.         . cetirizine (ZYRTEC) 10 MG tablet   Oral   Take 10 mg by mouth daily.         . citalopram (CELEXA) 20 MG tablet   Oral   Take 20 mg by mouth daily.         Marland Kitchen docusate sodium (COLACE) 100 MG capsule   Oral   Take 100 mg by mouth 2 (two) times daily.         Marland Kitchen donepezil (ARICEPT) 10 MG tablet   Oral   Take 10 mg by mouth at bedtime.         . lamoTRIgine (LAMICTAL) 100 MG tablet   Oral   Take 100 mg by mouth daily.         Marland Kitchen lovastatin (MEVACOR) 40 MG tablet   Oral   Take 40 mg by mouth at bedtime.         . meloxicam (MOBIC) 7.5 MG tablet   Oral   Take 7.5 mg by mouth daily.         . memantine (NAMENDA) 5 MG tablet   Oral   Take 5 mg by mouth 2 (two) times daily.          Marland Kitchen nystatin cream (MYCOSTATIN)   Topical   Apply 1 application topically 2 (two) times daily.         Marland Kitchen OLANZapine (ZYPREXA) 2.5 MG tablet   Oral   Take 2.5 mg by mouth at bedtime.         Marland Kitchen OLANZapine (ZYPREXA) 5 MG tablet   Oral   Take 5 mg by mouth at bedtime.           Allergies Cephalosporins; Codeine; Gabapentin; Penicillins; and Quinolones  History reviewed. No pertinent family history.  Social History History  Substance Use Topics  . Smoking status: Unknown If Ever Smoked  . Smokeless tobacco: Not on file  . Alcohol Use: No   no tobacco alcohol or drug use  Review of Systems  Constitutional: No fever or chills. No weight changes Eyes:No blurry vision or double vision.  ENT: No sore throat.  Cardiovascular: No chest pain. Respiratory: No dyspnea or cough. Gastrointestinal: Negative for abdominal pain, vomiting and diarrhea.  No BRBPR or melena. Genitourinary: Negative for dysuria, urinary retention, bloody urine, or difficulty urinating. Musculoskeletal: Negative for back pain. No joint swelling or pain. Skin: Negative for rash. Neurological: Negative for headaches, focal weakness or numbness. Psychiatric:No anxiety or depression.   Endocrine:No hot/cold intolerance, changes in energy, or sleep difficulty.  10-point ROS otherwise negative.  ____________________________________________   PHYSICAL EXAM:  VITAL SIGNS: ED Triage Vitals  Enc Vitals Group     BP 12/11/14 1649 142/73 mmHg     Pulse Rate 12/11/14 1649 58     Resp 12/11/14 1649 20     Temp 12/11/14 1649 97.8 F (36.6 C)     Temp Source 12/11/14 1649 Oral     SpO2 12/11/14 1649 96 %     Weight 12/11/14 1649 152 lb (68.947 kg)     Height 12/11/14 1649 5\' 7"  (1.702 m)     Head Cir --      Peak Flow --      Pain Score --      Pain Loc --      Pain Edu? --      Excl. in GC? --      Constitutional: Alert , nonverbal. Unable to assess orientation. Well appearing and in no  distress. Eyes: No scleral icterus. No conjunctival pallor. PERRL. EOMI ENT   Head: Normocephalic with 3 cm linear laceration through the lateral left eyebrow, hemostatic. All facial bones stable without crepitus step-off for mobility. Nontender..   Nose: No congestion/rhinnorhea. No septal hematoma   Mouth/Throat: MMM, no pharyngeal erythema. No peritonsillar mass. No uvula shift.   Neck: No stridor. No SubQ emphysema. No meningismus. Hematological/Lymphatic/Immunilogical: No cervical lymphadenopathy. Cardiovascular: RRR. Normal and symmetric distal pulses are present in all extremities. No murmurs, rubs, or gallops. Respiratory: Normal respiratory effort without tachypnea nor retractions. Breath sounds are clear and equal bilaterally. No wheezes/rales/rhonchi. Gastrointestinal: Soft and nontender. No distention. There is no CVA tenderness.  No rebound, rigidity, or guarding. Genitourinary: deferred Musculoskeletal: Nontender with normal range of motion in all extremities. No joint effusions.  No lower extremity tenderness.  No edema. Neurologic:   Nonverbal.  CN 2-10 normal. Motor grossly intact. No gross focal neurologic deficits are appreciated.  Skin:  Skin is warm, dry and intact. No rash noted.  No petechiae, purpura, or bullae.   ____________________________________________    LABS (pertinent positives/negatives) (all labs ordered are listed, but only abnormal results are displayed) Labs Reviewed - No data to display ____________________________________________   EKG  Interpreted by me Sinus bradycardia rate of 53, first-degree AV block with PR interval 232, normal axis, normal QRS ST segments and T waves  ____________________________________________    RADIOLOGY  CT head unremarkable  ____________________________________________   PROCEDURES LACERATION REPAIR Performed by: Scotty Court, Pernell Dikes Authorized by: Sharman Cheek Consent: Verbal consent  obtained. Risks and benefits: risks, benefits and alternatives were discussed Consent given by: patient Patient identity confirmed: provided demographic data Prepped and Draped in normal sterile fashion Wound explored  Laceration Location: Left lateral eyebrow  Laceration Length: 3cm  No Foreign Bodies seen or palpated  Anesthesia: local infiltration  Local anesthetic: lidocaine 2% without epinephrine  Anesthetic total: 2 ml  Irrigation method: syringe Amount of cleaning: standard  Skin closure: 5-0 Monocryl   Number of sutures: 2  Technique: Simple interrupted   Patient tolerance: Patient tolerated the procedure well with no immediate complications. Wound  hemostatic after repair  ____________________________________________   INITIAL IMPRESSION / ASSESSMENT AND PLAN / ED COURSE  Pertinent labs & imaging results that were available during my care of the patient were reviewed by me and considered in my medical decision making (see chart for details).  Patient presents with mechanical fall resulting in a small scalp laceration through the left eyebrow. This was repaired. Further history obtained by the family at bedside after they arrived, they note no other concerns except for the incidental fall and head injury. Workup negative in the ED, we'll discharge home.  ____________________________________________   FINAL CLINICAL IMPRESSION(S) / ED DIAGNOSES  Final diagnoses:  Fall, initial encounter  Eyebrow laceration, left, initial encounter      Sharman Cheek, MD 12/11/14 1839

## 2014-12-11 NOTE — ED Notes (Signed)
Pt arrived via EMS from [facility] with a fall. Pt presents with laceration on left brow and bruises on left arm. Pt is mute.

## 2015-02-18 DEATH — deceased

## 2015-03-11 IMAGING — CT CT CERVICAL SPINE WITHOUT CONTRAST
4 of 5 series · 14 of 33 positions shown, 16 images · non-contrast
Comparison: 06/12/2014

CLINICAL DATA: Unwitnessed fall today.  Dementia.  Nonverbal.

EXAM:
CT HEAD WITHOUT CONTRAST
CT CERVICAL SPINE WITHOUT CONTRAST
TECHNIQUE: Multidetector CT imaging of the head and cervical spine was
performed following the standard protocol without intravenous
contrast. Multiplanar CT image reconstructions of the cervical spine
were also generated.

[Series 5: soft tissue · axial · 0.35mm/px · z∈[-282,-250]mm · 2 of 81 slices shown]
[im 17/81  soft-tissue]
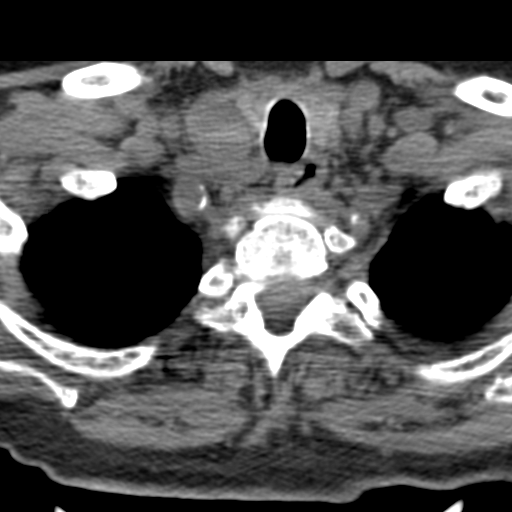
[im 33/81  soft-tissue]
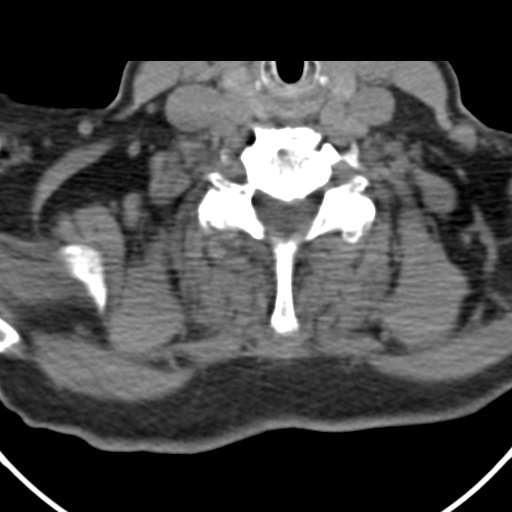

[Series 8: sagittal bone · sagittal · 0.30mm/px · 5 of 54 slices shown, 6 images]
[im 18/54  bone]
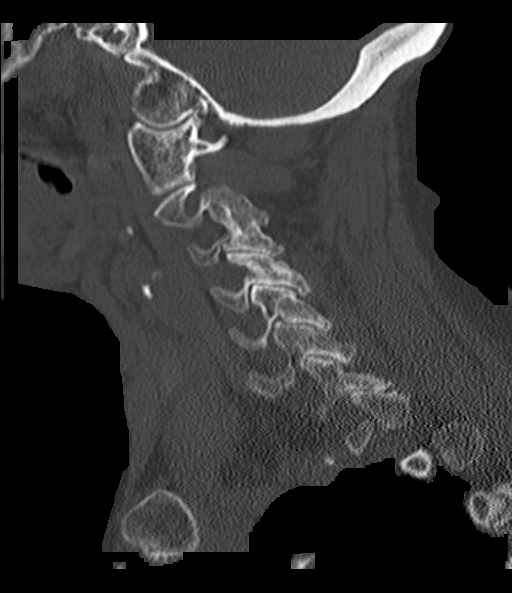
[im 23/54  bone]
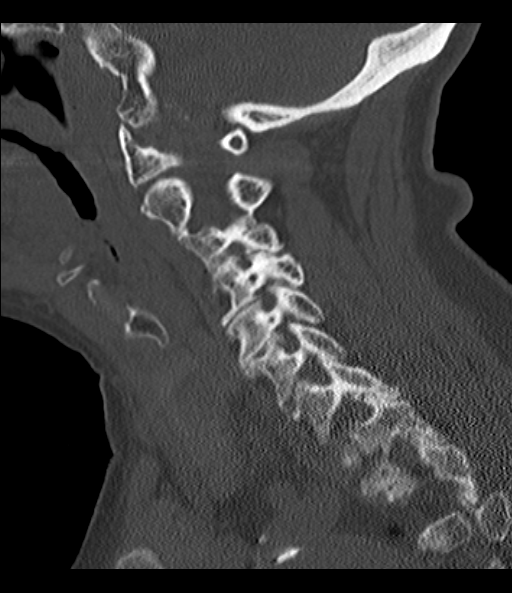
[im 27/54  soft-tissue]
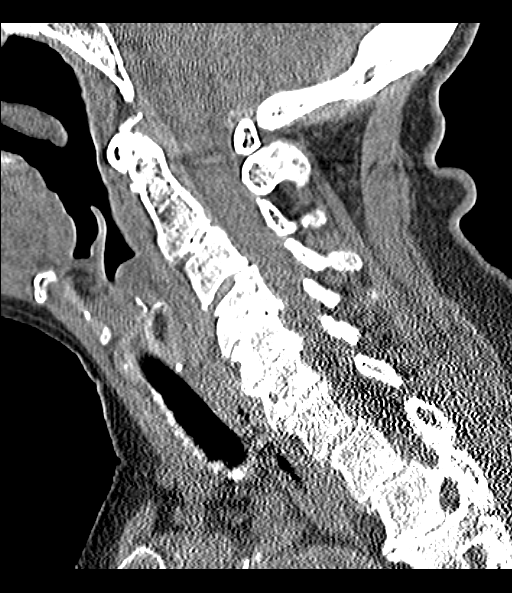
[im 27/54  bone]
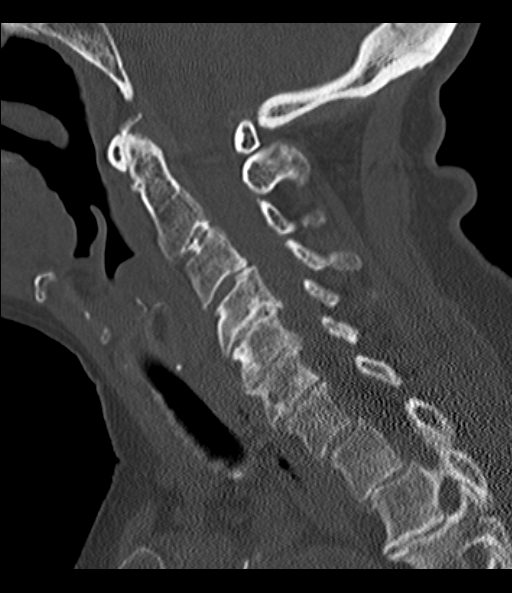
[im 31/54  bone]
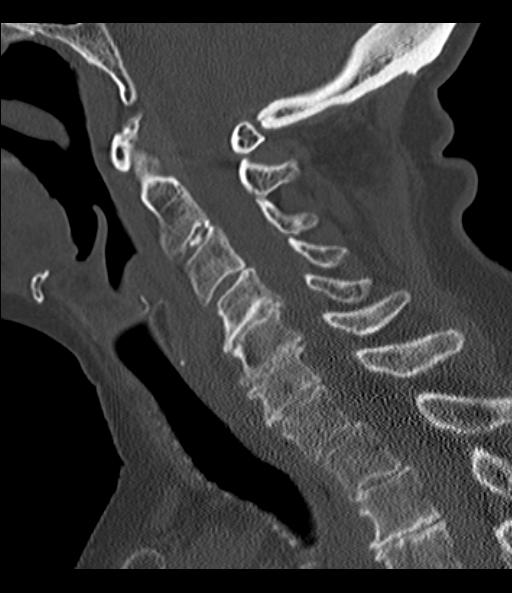
[im 36/54  bone]
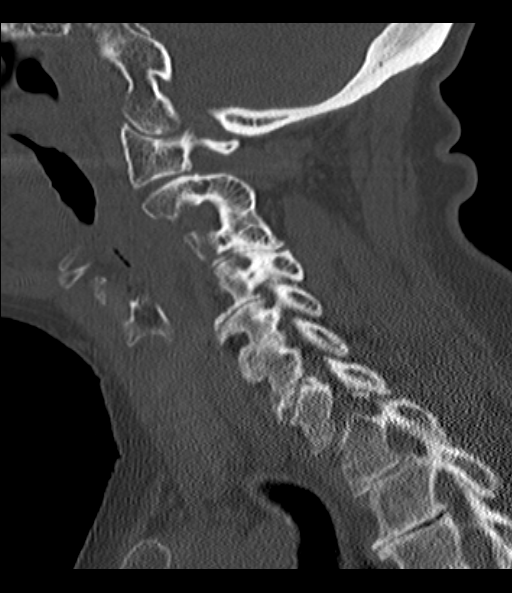

[Series 9: coronal bone · coronal · 0.21mm/px · 3 of 54 slices shown]
[im 11/54  bone]
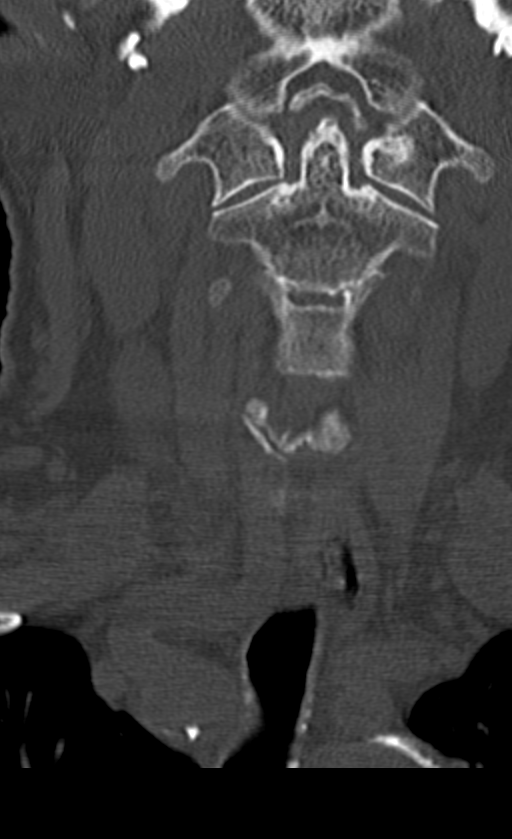
[im 22/54  bone]
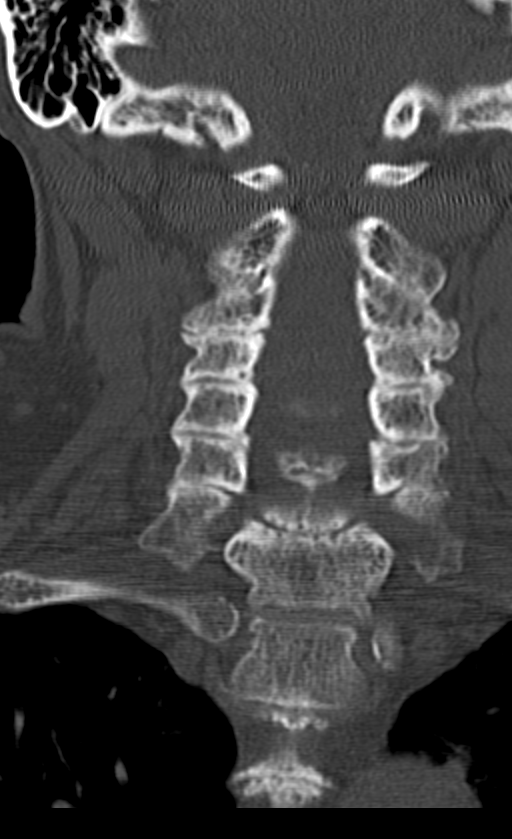
[im 32/54  bone]
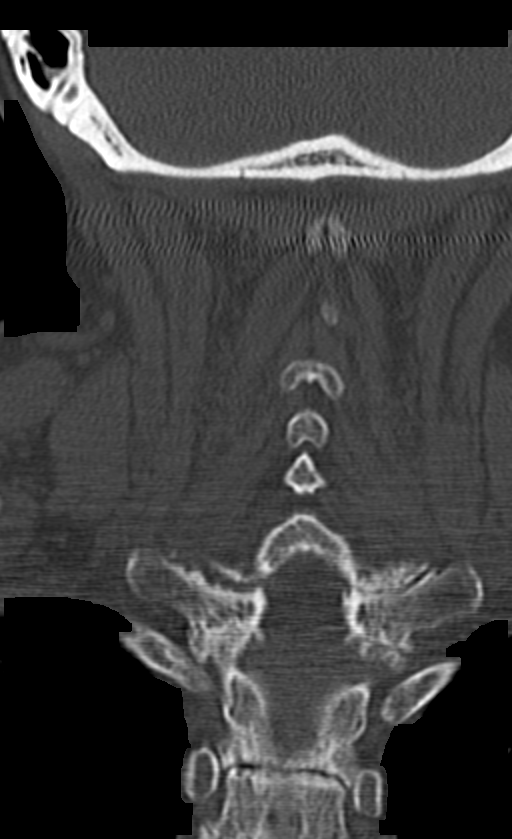

[Series 10: axial · axial · 0.21mm/px · z∈[-303,-204]mm · 4 of 90 slices shown, 5 images]
[im 18/90  soft-tissue]
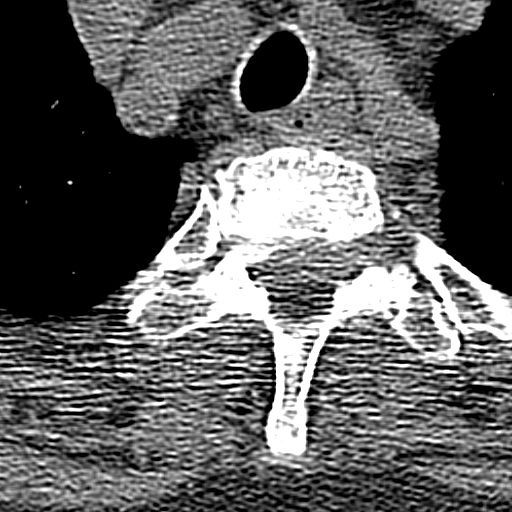
[im 18/90  bone]
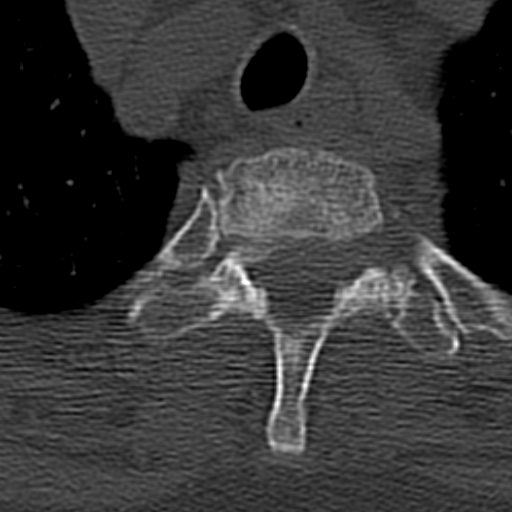
[im 36/90  bone]
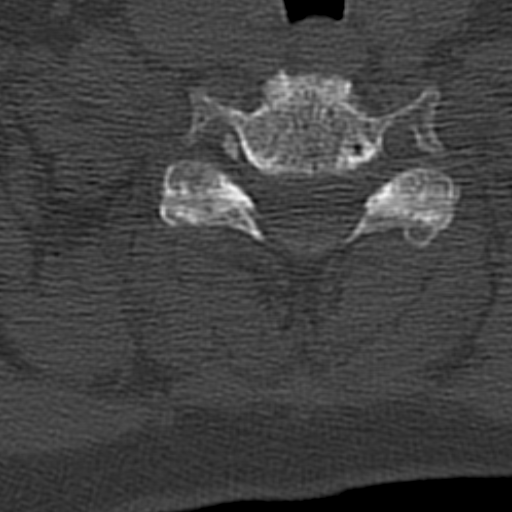
[im 54/90  bone]
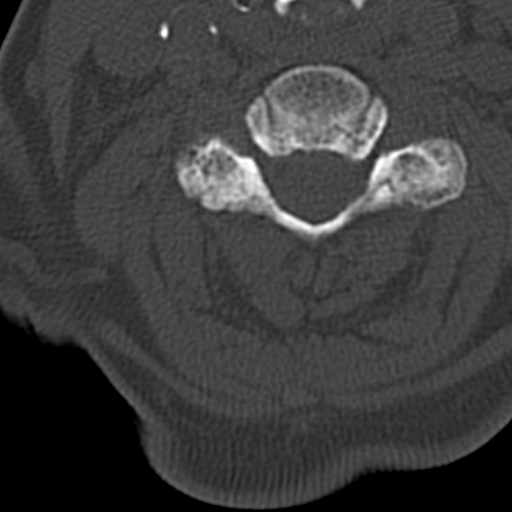
[im 72/90  bone]
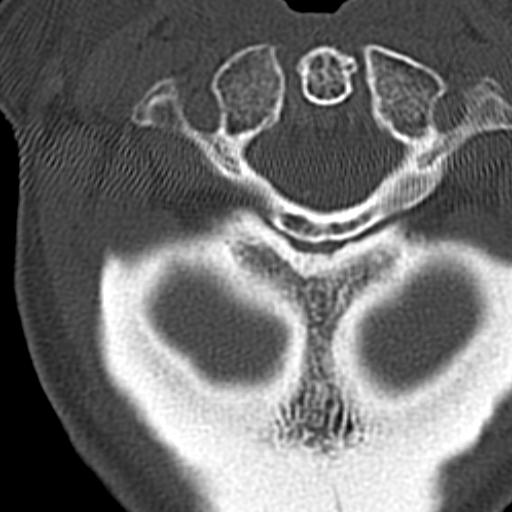

[14 of 33 positions shown; findings below may reference images not displayed]

FINDINGS: CT HEAD FINDINGS

Vague curvilinear calcifications with mass effect along the left
anterior parafalcine region. Lesion measures about 2.9 by 3.5 cm.
Mild adjacent white matter edema. Mild left-to-right midline shift
of approximately 6 mm. Appearance is similar to prior study. This
probably represents a meningioma. Mild diffuse cerebral atrophy.
Mild ventricular dilatation likely due to central atrophy.
Low-attenuation changes in the deep white matter consistent with
small vessel ischemia. Gray-white matter junctions are distinct.
Basal cisterns are not effaced. No acute intracranial hemorrhage.
Calvarium appears intact. No depressed skull fractures identified.
Visualized paranasal sinuses and mastoid air cells are clear.

CT CERVICAL SPINE FINDINGS

Prominent diffuse degenerative changes throughout the cervical spine
with narrowed cervical interspaces and endplate hypertrophic changes
throughout. Degenerative changes in the cervical facet joints.
Prominent degenerative change at C1 to with ligamentous
calcification. Prominent posterior hypertrophic changes at C3-4,
C4-5, C5-6, and C6-7 levels. Slight anterior subluxation of C7 on T1
and T1 on T2. This is unchanged since prior study and likely
degenerative. Normal alignment of the facet joints. No vertebral
compression deformities. No prevertebral soft tissue swelling. No
focal bone lesion or bone destruction. Vascular calcifications in
the soft tissues. Visualized lung apices are grossly clear.
IMPRESSION: No acute intracranial abnormalities. Unchanged appearance of left
parafalcine mass, likely meningioma. Chronic atrophy and small
vessel ischemic changes.

Prominent degenerative changes throughout the cervical spine.
Appearance and alignment are not change since prior study. No
displaced acute fractures identified.
